# Patient Record
Sex: Male | Born: 1955 | Race: White | Hispanic: No | Marital: Married | Smoking: Never smoker
Health system: Southern US, Community
[De-identification: ages and names within clinical notes are randomized; demographics above are authoritative.]

---

## 1999-03-09 ENCOUNTER — Ambulatory Visit: Admission: RE | Admit: 1999-03-09 | Discharge: 1999-03-09 | Payer: Self-pay | Admitting: Surgery

## 1999-03-09 ENCOUNTER — Encounter: Payer: Self-pay | Admitting: Surgery

## 2002-11-22 ENCOUNTER — Ambulatory Visit (HOSPITAL_COMMUNITY): Admission: RE | Admit: 2002-11-22 | Discharge: 2002-11-22 | Payer: Self-pay | Admitting: Pulmonary Disease

## 2005-05-30 IMAGING — CT CT PELVIS W/O CM
1 series · 15 of 32 positions shown, 19 images · non-contrast
Comparison: none

CLINICAL DATA: Left-sided renal colic and flank pain.  Previous history of renal calculi.
TECHNIQUE: Multidetector helical CT of the abdomen and pelvis was performed following oral contrast administration.  No intravenous contrast was administered due to the patient?s elevated serum creatinine level.  There are no prior studies available for comparison.

[Series 7490: — · axial · 0.78mm/px · z∈[+1370,+1824]mm · 15 of 102 slices shown, 19 images]
[im 7/102  soft-tissue]
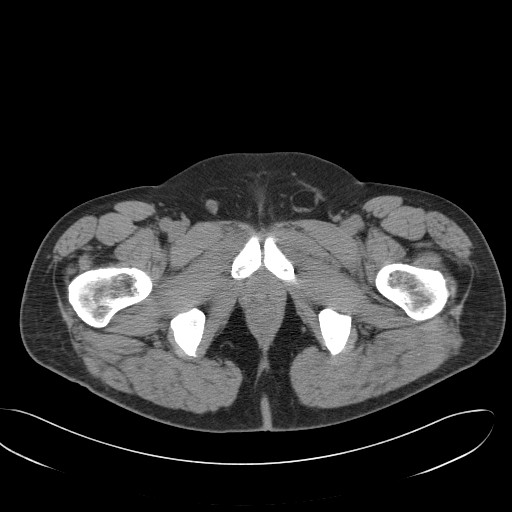
[im 7/102  bone]
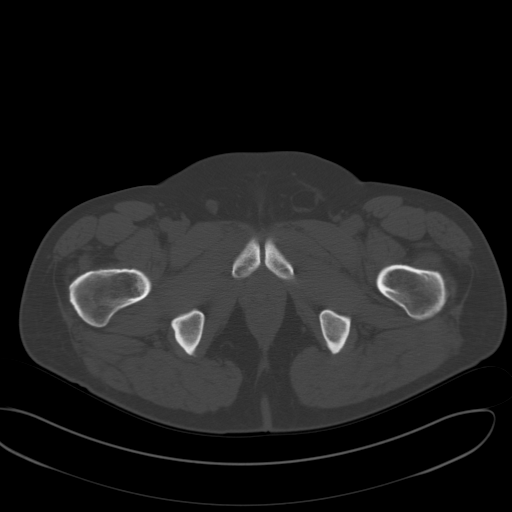
[im 14/102  soft-tissue]
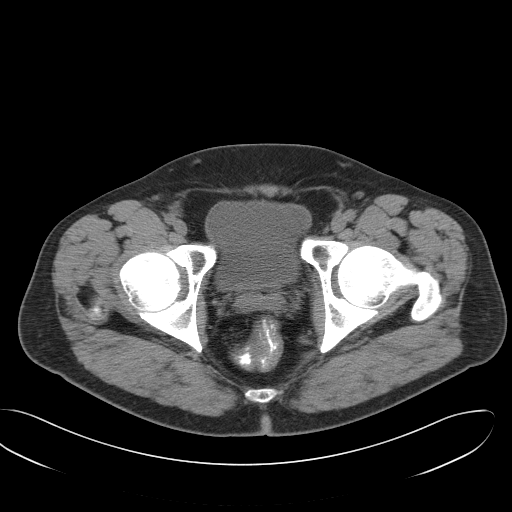
[im 20/102  soft-tissue]
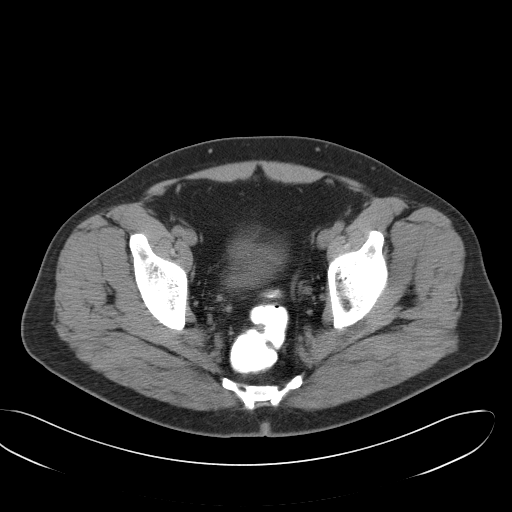
[im 30/102  soft-tissue]
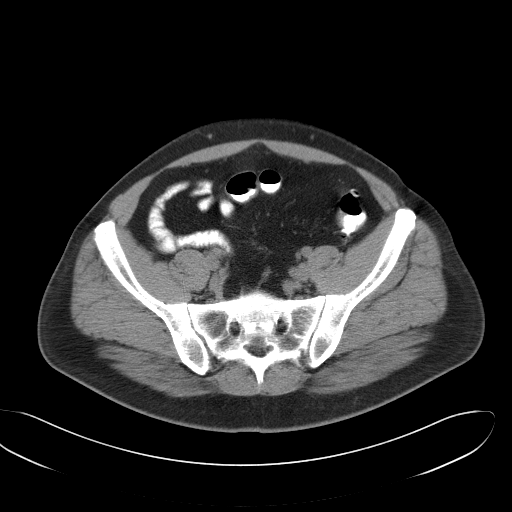
[im 36/102  soft-tissue]
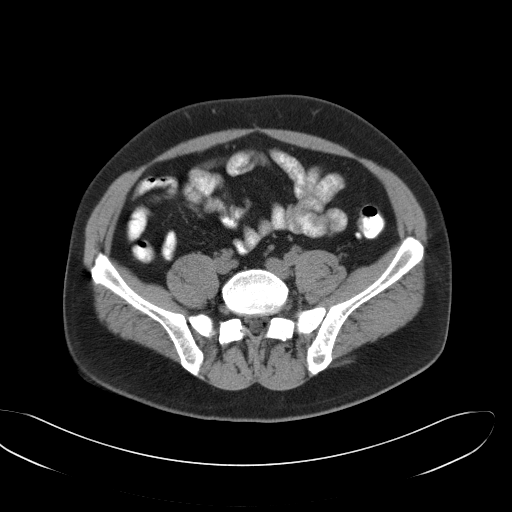
[im 43/102  soft-tissue]
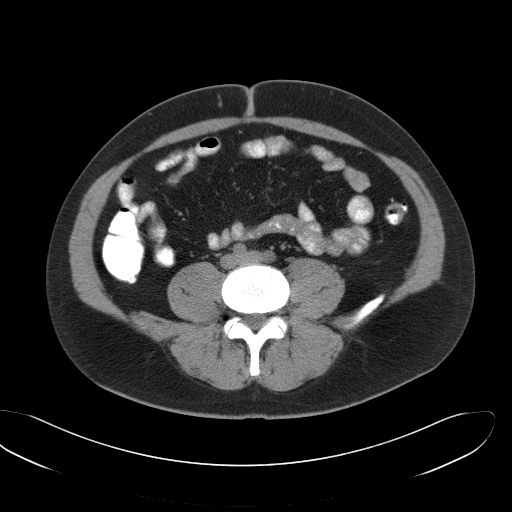
[im 53/102  soft-tissue]
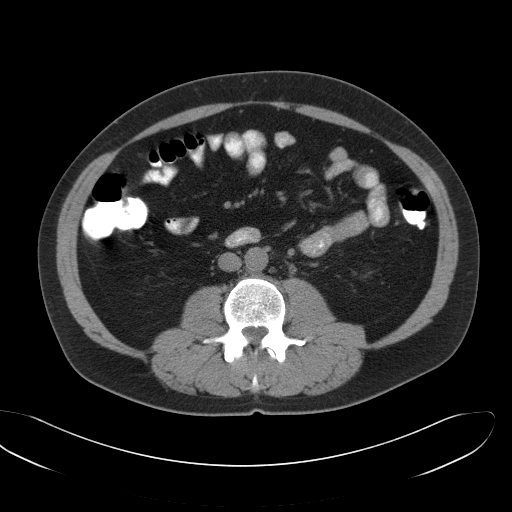
[im 59/102  soft-tissue]
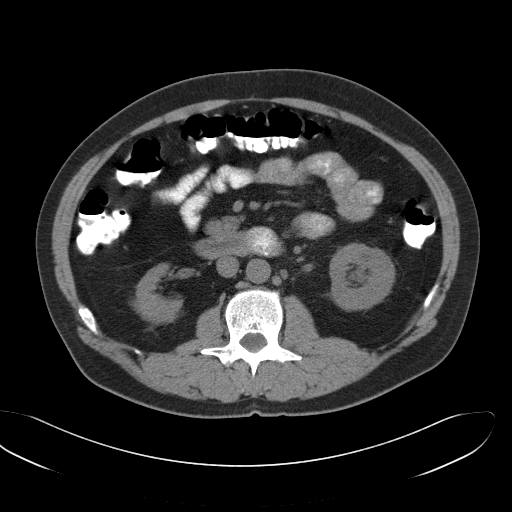
[im 66/102  soft-tissue]
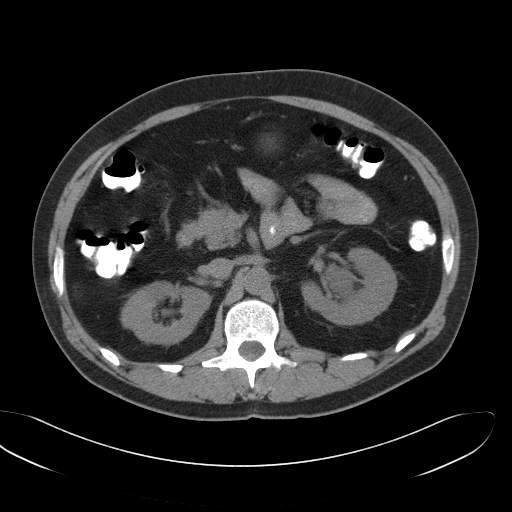
[im 66/102  bone]
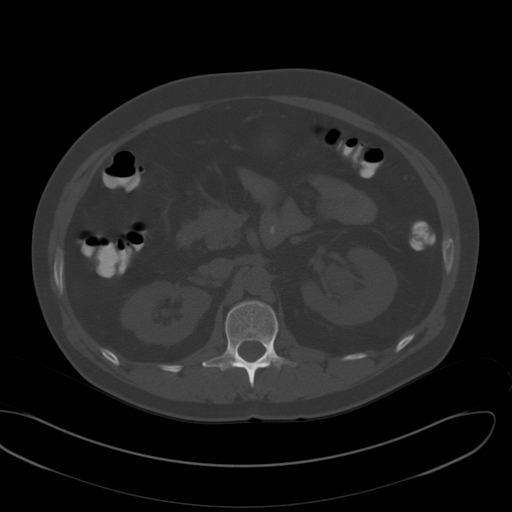
[im 72/102  soft-tissue]
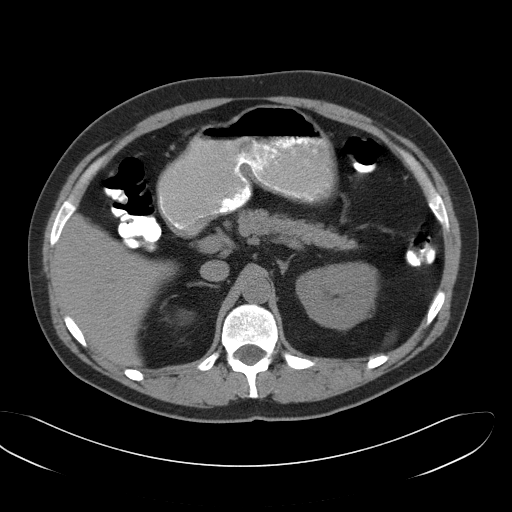
[im 82/102  soft-tissue]
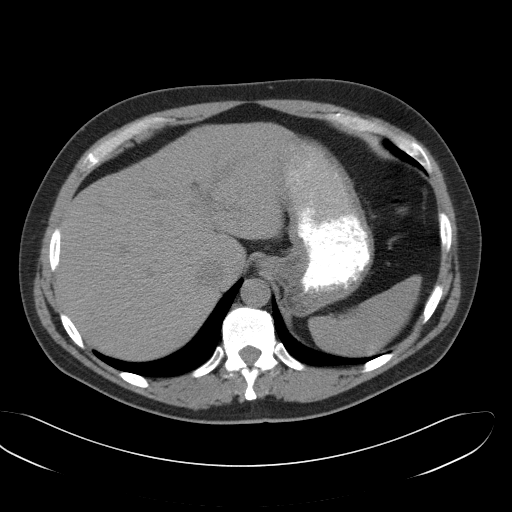
[im 88/102  soft-tissue]
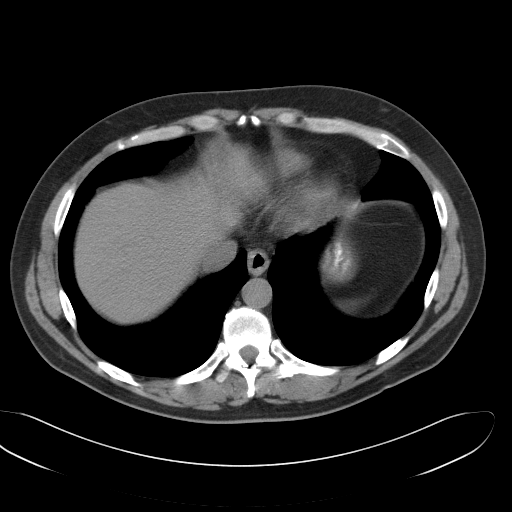
[im 88/102  lung]
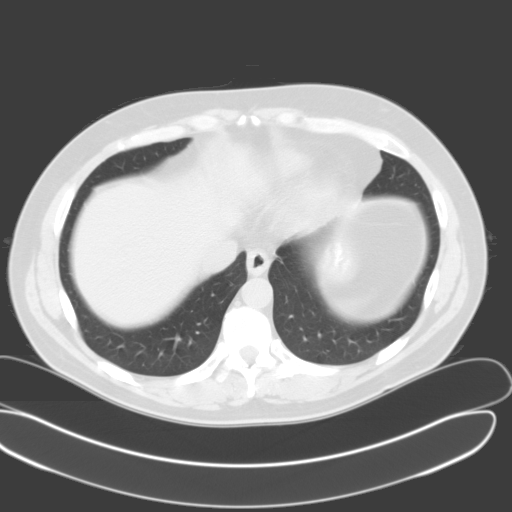
[im 92/102  lung]
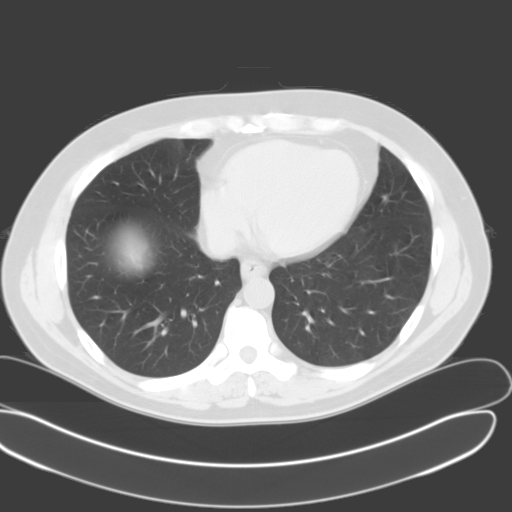
[im 95/102  soft-tissue]
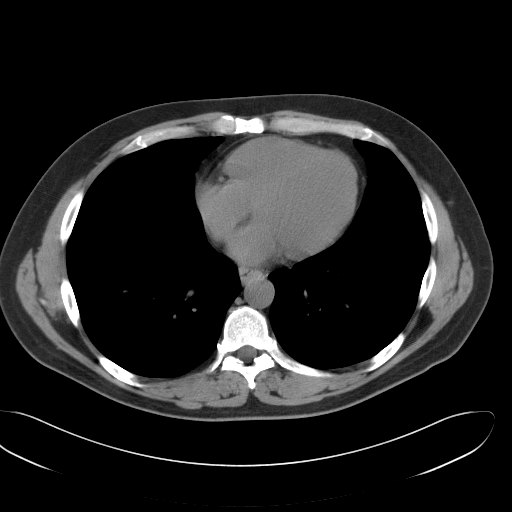
[im 95/102  lung]
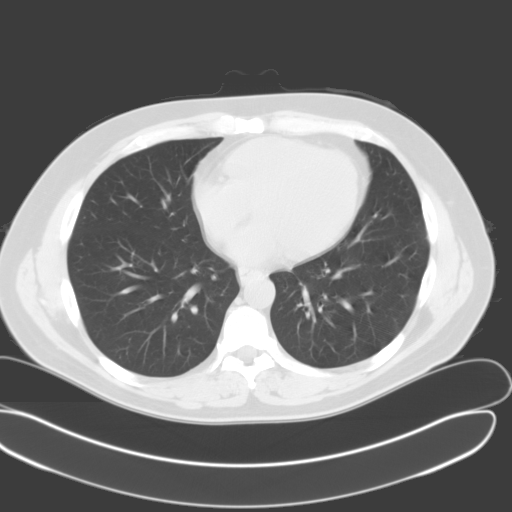
[im 98/102  lung]
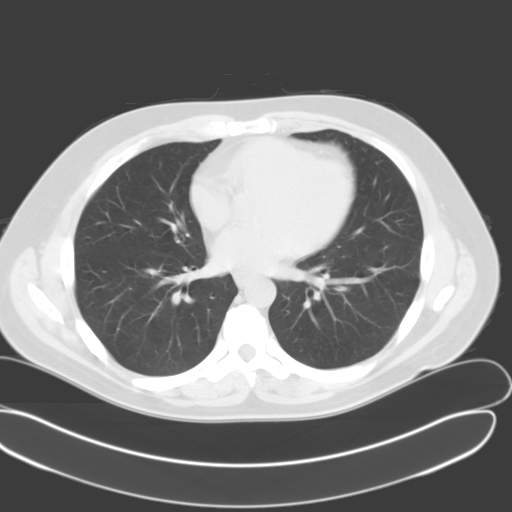

[15 of 32 positions shown; findings below may reference images not displayed]

ABDOMEN CT WITHOUT CONTRAST
 A tiny 1 mm calculus is noted in the lower pole of the left kidney.  A tiny less than 1 cm low attenuation lesion is also seen in the anterior lower pole of the left kidney, likely representing a small cyst.  Mild left pelvocaliectasis is seen as well as ureterectasis.  There is no evidence of right-sided pelvocaliectasis.  No right renal calculi are seen.  

 The other abdominal parenchymal organs are unremarkable in appearance on this noncontrast study.  The gallbladder is unremarkable.  There is no evidence of mass or inflammatory process.  Diverticulosis of the descending colon is noted however, there is no evidence of diverticulitis.

 IMPRESSION
 1.  Mild left-sided pelvocaliectasis and ureterectasis.  See pelvis CT report below.
 2.  Tiny 1 mm calculus in the lower pole of the left kidney and probable tiny left renal cyst.
 3.  Diverticulosis of the descending colon.  No evidence of diverticulitis.

 PELVIS CT WITHOUT CONTRAST
 Left ureterectasis is seen.  There is a calculus in the distal left ureter measuring 6 x 7 mm.  This is approximately 1 cm proximal to the ureterovesical junction.  No calculi are seen within the urinary bladder.  

 Diverticulosis of the sigmoid colon is seen.  However, there is no evidence of diverticulitis.  There is no evidence of other inflammatory processes or abnormal fluid collections in the pelvis.  There is no evidence of pelvic masses or adenopathy.

 IMPRESSION
 1.  6 x 7 mm calculus in the distal left ureter.  This results in mild left hydronephrosis and ureterectasis.
 2.  Sigmoid diverticulosis.  No evidence of diverticulitis.

## 2018-11-19 ENCOUNTER — Other Ambulatory Visit: Payer: Self-pay

## 2018-11-19 DIAGNOSIS — Z20822 Contact with and (suspected) exposure to covid-19: Secondary | ICD-10-CM

## 2018-11-20 LAB — NOVEL CORONAVIRUS, NAA: SARS-CoV-2, NAA: NOT DETECTED

## 2020-04-04 ENCOUNTER — Ambulatory Visit (HOSPITAL_COMMUNITY)
Admission: RE | Admit: 2020-04-04 | Discharge: 2020-04-04 | Disposition: A | Discharge status: Home / Self Care | Payer: Medicare Other | Source: Ambulatory Visit | Attending: Family Medicine | Admitting: Family Medicine

## 2020-04-04 ENCOUNTER — Other Ambulatory Visit: Payer: Self-pay

## 2020-04-04 DIAGNOSIS — Z136 Encounter for screening for cardiovascular disorders: Secondary | ICD-10-CM | POA: Diagnosis not present

## 2020-04-04 DIAGNOSIS — R011 Cardiac murmur, unspecified: Secondary | ICD-10-CM | POA: Insufficient documentation

## 2020-07-03 ENCOUNTER — Ambulatory Visit: Payer: Medicare Other | Admitting: Internal Medicine

## 2020-07-03 ENCOUNTER — Ambulatory Visit (HOSPITAL_COMMUNITY)
Admission: RE | Admit: 2020-07-03 | Discharge: 2020-07-03 | Disposition: A | Discharge status: Home / Self Care | Payer: Medicare Other | Source: Ambulatory Visit | Attending: Internal Medicine | Admitting: Internal Medicine

## 2020-07-03 ENCOUNTER — Other Ambulatory Visit: Payer: Self-pay

## 2020-07-03 ENCOUNTER — Encounter: Payer: Self-pay | Admitting: Internal Medicine

## 2020-07-03 VITALS — BP 148/60 | HR 79 | Ht 67.0 in | Wt 173.4 lb

## 2020-07-03 DIAGNOSIS — R011 Cardiac murmur, unspecified: Secondary | ICD-10-CM | POA: Diagnosis not present

## 2020-07-03 DIAGNOSIS — R0989 Other specified symptoms and signs involving the circulatory and respiratory systems: Secondary | ICD-10-CM | POA: Diagnosis not present

## 2020-07-03 LAB — ECHOCARDIOGRAM COMPLETE
AR max vel: 1.37 cm2
AV Area VTI: 1.37 cm2
AV Area mean vel: 1.39 cm2
AV Mean grad: 7 mmHg
AV Peak grad: 14.4 mmHg
Ao pk vel: 1.9 m/s
Area-P 1/2: 3.3 cm2
Height: 67 in
S' Lateral: 2.7 cm
Weight: 2774.4 oz

## 2020-07-03 NOTE — Progress Notes (Signed)
*  PRELIMINARY RESULTS* Echocardiogram 2D Echocardiogram has been performed.  Stacey Drain 07/03/2020, 2:40 PM

## 2020-07-03 NOTE — Patient Instructions (Signed)
Medication Instructions:  Your physician recommends that you continue on your current medications as directed. Please refer to the Current Medication list given to you today.  *If you need a refill on your cardiac medications before your next appointment, please call your pharmacy*   Lab Work: NONE   If you have labs (blood work) drawn today and your tests are completely normal, you will receive your results only by: MyChart Message (if you have MyChart) OR A paper copy in the mail If you have any lab test that is abnormal or we need to change your treatment, we will call you to review the results.   Testing/Procedures: Your physician has requested that you have an echocardiogram. Echocardiography is a painless test that uses sound waves to create images of your heart. It provides your doctor with information about the size and shape of your heart and how well your heart's chambers and valves are working. This procedure takes approximately one hour. There are no restrictions for this procedure.  Your physician has requested that you have a carotid duplex. This test is an ultrasound of the carotid arteries in your neck. It looks at blood flow through these arteries that supply the brain with blood. Allow one hour for this exam. There are no restrictions or special instructions.    Follow-Up: At [facility], you and your health needs are our priority.  As part of our continuing mission to provide you with exceptional heart care, we have created designated Provider Care Teams.  These Care Teams include your primary Cardiologist (physician) and Advanced Practice Providers (APPs -  Physician Assistants and Nurse Practitioners) who all work together to provide you with the care you need, when you need it.  We recommend signing up for the patient portal called "MyChart".  Sign up information is provided on this After Visit Summary.  MyChart is used to connect with patients for Virtual Visits  (Telemedicine).  Patients are able to view lab/test results, encounter notes, upcoming appointments, etc.  Non-urgent messages can be sent to your provider as well.   To learn more about what you can do with MyChart, go to [email or web address].    Your next appointment:    Pending test results   The format for your next appointment:   In Person  Provider:   Dietrich Pates, MD   Other Instructions Thank you for choosing [facility]!

## 2020-07-03 NOTE — Progress Notes (Signed)
   Cardiology Office Note   Date:  07/03/2020   ID:  Terry Wagner, DOB 12/17/1955, MRN [id number]  PCP:  Patient, No Pcp Per (Inactive)  Cardiologist:   Dietrich Pates, MD  Pt referred for evaluation of murmur by IM      History of Present Illness: Terry Wagner is a 65 y.o. male with a history of HTN and HL  Also Hx murmur    Seen in IM clinic recently   Told had a murmur   Setnt to cardiology  The pt denies CP  Breathing is OK  No dizziness  No palpitatoins         Current Meds  Medication Sig   Ascorbic Acid (VITAMIN C) 1000 MG tablet Take 1,000 mg by mouth daily.   Cholecalciferol (VITAMIN D3) 25 MCG (1000 UT) CAPS Take by mouth.   Multiple Vitamin (MULTIVITAMIN ADULT PO) Take by mouth.     Allergies:   Patient has no allergy information on record.   History reviewed. No pertinent past medical history.  History reviewed. No pertinent surgical history.   Social History:  The patient  reports that he has never smoked. He has never been exposed to tobacco smoke. He has never used smokeless tobacco. He reports that he does not drink alcohol and does not use drugs.   Family History:  The patient's family history includes Cancer in his daughter and sister.    ROS:  Please see the history of present illness. All other systems are reviewed and  Negative to the above problem except as noted.    PHYSICAL EXAM: VS:  BP (!) 148/60   Pulse 79   Ht 5\' 7"  (1.702 m)   Wt 173 lb 6.4 oz (78.7 kg)   SpO2 98%   BMI 27.16 kg/m   GEN: Well nourished, well developed, in no acute distress  HEENT: normal  Neck: no JVD  Bilateral bruits Cardiac: RRR; Gr II/VI systolic murmur base   No LE 2+ pulses PT  Respiratory:  clear to auscultation bilaterally, normal work of breathing GI: soft, nontender, nondistended, + BS  No hepatomegaly  MS: no deformity Moving all extremities   Skin: warm and dry, no rash Neuro:  Strength and sensation are intact Psych: euthymic mood, full  affect   EKG:  EKG is not  ordered today.  On 04/04/20 NSR   LVH with repol abnormaltity    Lipid Panel No results found for: CHOL, TRIG, HDL, CHOLHDL, VLDL, LDLCALC, LDLDIRECT    Wt Readings from Last 3 Encounters:  07/03/20 173 lb 6.4 oz (78.7 kg)      ASSESSMENT AND PLAN:  1  Murmur  Murmur sounds like aortic sclerosis   Will set up for an echo to evaluate  2  Carotid bruits   St up for USN of neck  3  HTN  BP is a little elevated today   Follow for now  Not on any agnets   F/U based on results     Current medicines are reviewed at length with the patient today.  The patient does not have concerns regarding medicines.  Signed, 07/05/20, MD  07/03/2020 9:35 AM    [facility] [address] [place], [place], [place]  [place] Phone: [phone]; Fax: [phone]

## 2020-07-11 ENCOUNTER — Ambulatory Visit (HOSPITAL_COMMUNITY)
Admission: RE | Admit: 2020-07-11 | Discharge: 2020-07-11 | Disposition: A | Discharge status: Home / Self Care | Payer: Medicare Other | Source: Ambulatory Visit | Attending: Internal Medicine | Admitting: Internal Medicine

## 2020-07-11 ENCOUNTER — Other Ambulatory Visit: Payer: Self-pay

## 2020-07-11 DIAGNOSIS — R0989 Other specified symptoms and signs involving the circulatory and respiratory systems: Secondary | ICD-10-CM | POA: Diagnosis present

## 2020-07-13 ENCOUNTER — Telehealth: Payer: Self-pay | Admitting: Nurse Practitioner

## 2020-07-13 ENCOUNTER — Other Ambulatory Visit: Payer: Self-pay | Admitting: Nurse Practitioner

## 2020-07-13 DIAGNOSIS — I6523 Occlusion and stenosis of bilateral carotid arteries: Secondary | ICD-10-CM

## 2020-07-13 DIAGNOSIS — E785 Hyperlipidemia, unspecified: Secondary | ICD-10-CM

## 2020-07-13 MED ORDER — ASPIRIN 81 MG PO TBEC: 81.0000 mg | DELAYED_RELEASE_TABLET | Freq: Every day | ORAL | Status: AC

## 2020-07-13 NOTE — Telephone Encounter (Signed)
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

## 2020-07-13 NOTE — Progress Notes (Signed)
Aspirin 81 mg daily added to patient's medication list

## 2020-07-13 NOTE — Telephone Encounter (Signed)
Left detailed voicemail regarding lab appointment scheduled for 7/6 between the hours of 7:30 am and 4:45 pm at our office. Advised him to call back to reschedule if needed. Orders for lipid studies and referral to [facility] placed.

## 2020-07-19 ENCOUNTER — Other Ambulatory Visit: Payer: Medicare Other

## 2020-07-19 ENCOUNTER — Other Ambulatory Visit: Payer: Self-pay

## 2020-07-19 DIAGNOSIS — E785 Hyperlipidemia, unspecified: Secondary | ICD-10-CM

## 2020-07-19 DIAGNOSIS — I6523 Occlusion and stenosis of bilateral carotid arteries: Secondary | ICD-10-CM

## 2020-07-20 LAB — NMR LIPOPROF + GRAPH
Cholesterol, Total: 235 mg/dL — ABNORMAL HIGH (ref 100–199)
HDL Particle Number: 32.7 umol/L (ref >=30.5)
HDL-C: 40 mg/dL (ref >39)
LDL Particle Number: 1549 nmol/L — ABNORMAL HIGH (ref <1000)
LDL Size: 20.1 nm — ABNORMAL LOW (ref >20.5)
LDL-C (NIH Calc): 155 mg/dL — ABNORMAL HIGH (ref 0–99)
LP-IR Score: 65 — ABNORMAL HIGH (ref <=45)
Small LDL Particle Number: 799 nmol/L — ABNORMAL HIGH (ref <=527)
Triglycerides: 218 mg/dL — ABNORMAL HIGH (ref 0–149)

## 2020-07-20 LAB — APOLIPOPROTEIN B: Apolipoprotein B: 114 mg/dL — ABNORMAL HIGH (ref <90)

## 2020-07-20 LAB — LIPOPROTEIN A (LPA): Lipoprotein (a): 113.9 nmol/L — ABNORMAL HIGH (ref <75.0)

## 2020-07-24 ENCOUNTER — Telehealth: Payer: Self-pay | Admitting: *Deleted

## 2020-07-24 DIAGNOSIS — E785 Hyperlipidemia, unspecified: Secondary | ICD-10-CM

## 2020-07-24 MED ORDER — ROSUVASTATIN CALCIUM 40 MG PO TABS: 40.0000 mg | ORAL_TABLET | Freq: Every day | ORAL | 11 refills | Status: AC

## 2020-07-24 NOTE — Telephone Encounter (Signed)
-----   Message from Dietrich Pates V, MD sent at 07/23/2020  9:40 PM EDT ----- LDL is 155  Goal less than 70    Particle number high   ApoB and Lp(a) high   Triglcyerides elevated at 218   Watch seets. Should be on statin to lower   Start Crestor 40 mg    F/U lipids in 8 wks with AST Watch sweets

## 2020-07-24 NOTE — Telephone Encounter (Signed)
Pt notified of plan of care and verbalized understanding

## 2023-01-17 IMAGING — US US CAROTID DUPLEX BILAT
1 series · 13 of 24 positions shown · non-contrast
Comparison: None.

CLINICAL DATA: Hypertension, asymptomatic carotid bruit

EXAM:
BILATERAL CAROTID DUPLEX ULTRASOUND
TECHNIQUE: Gray scale imaging, color Doppler and duplex ultrasound were
performed of bilateral carotid and vertebral arteries in the neck.

[Series 1: us carotid bilateral · 13 of 70 slices shown]
[im 1/70]
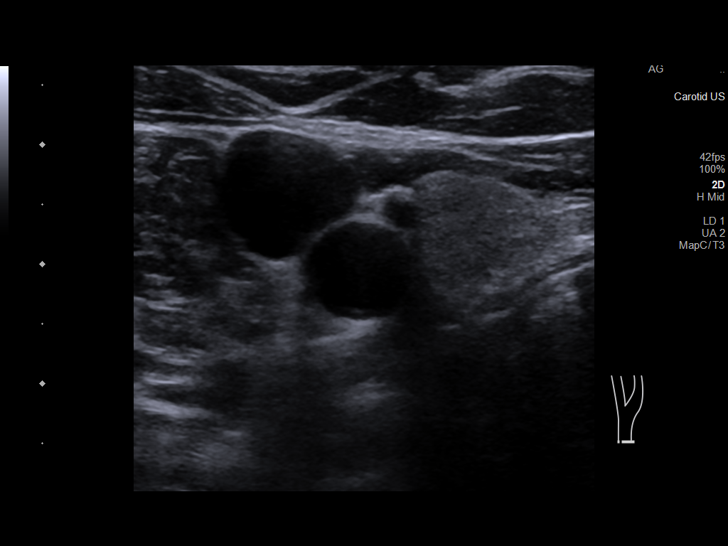
[im 7/70]
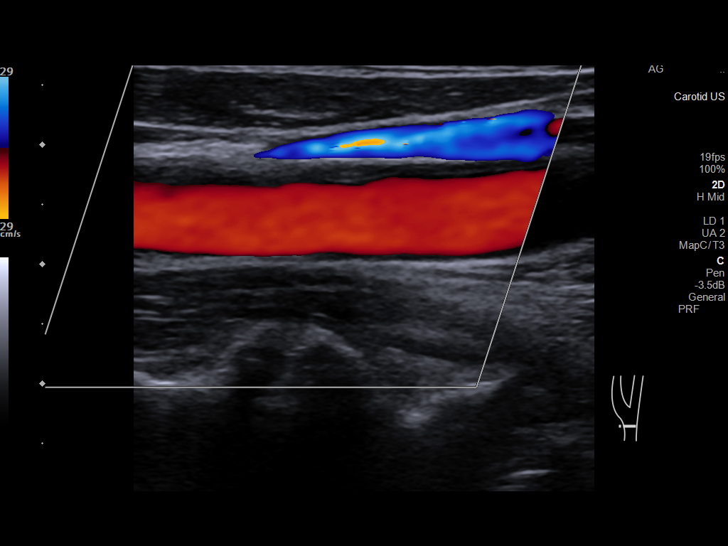
[im 13/70]
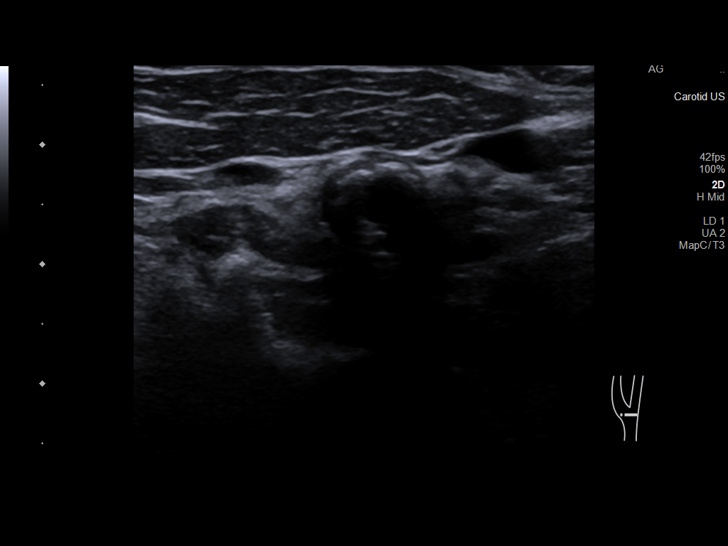
[im 19/70]
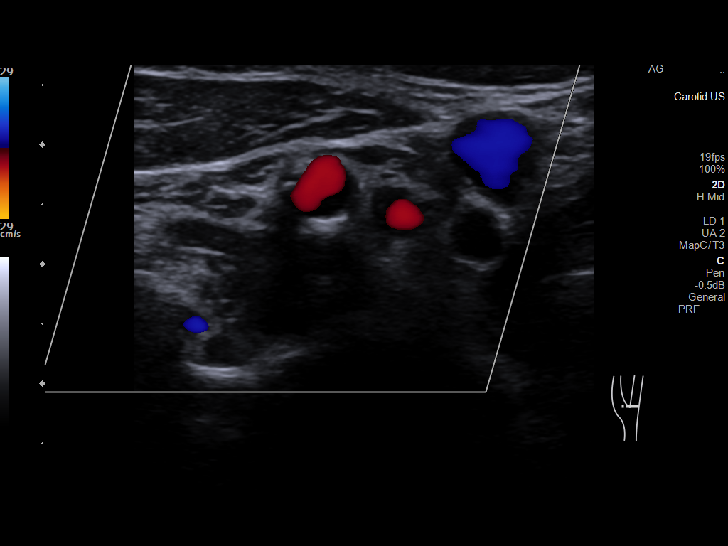
[im 25/70]
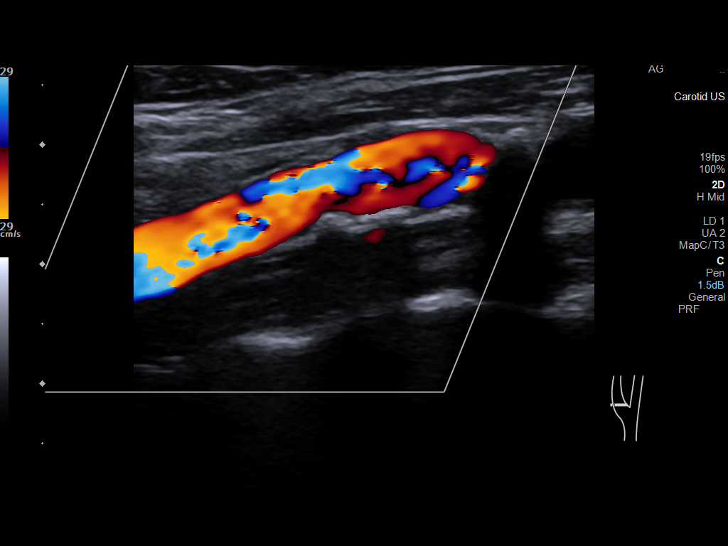
[im 31/70]
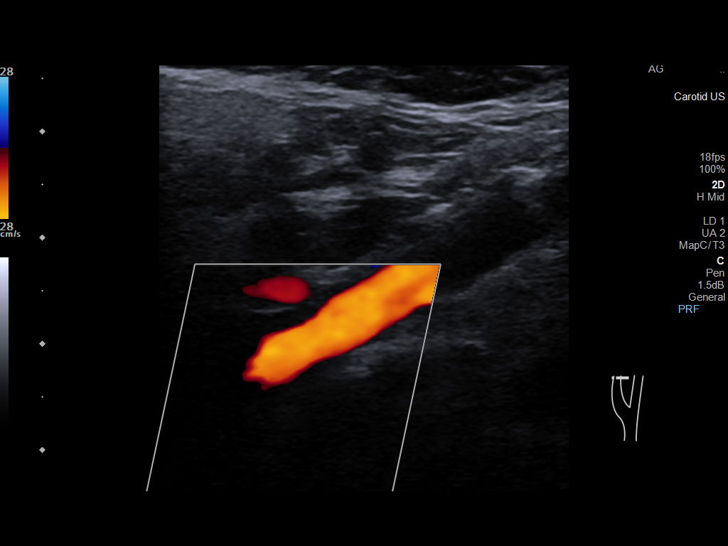
[im 37/70]
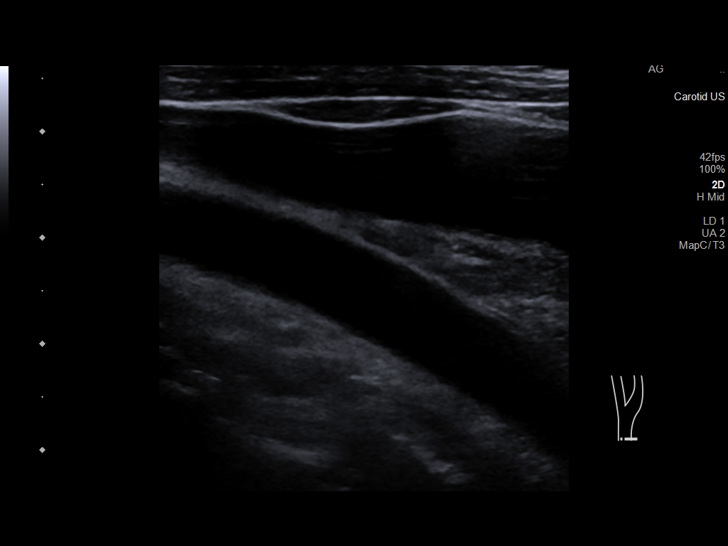
[im 40/70]
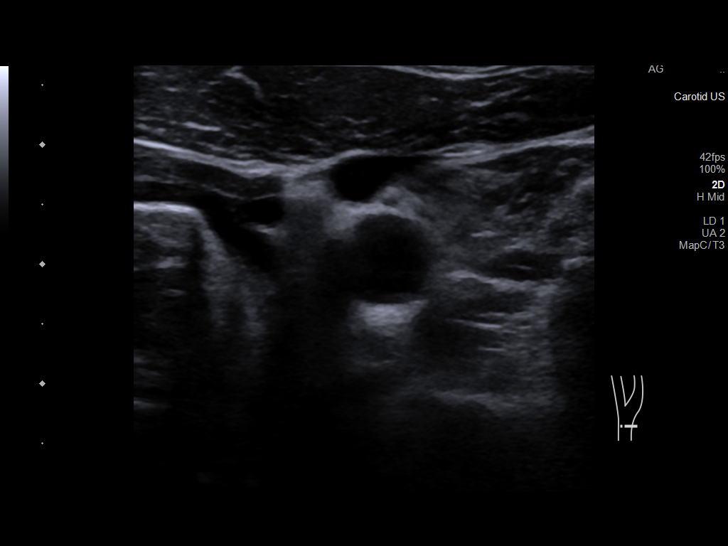
[im 46/70]
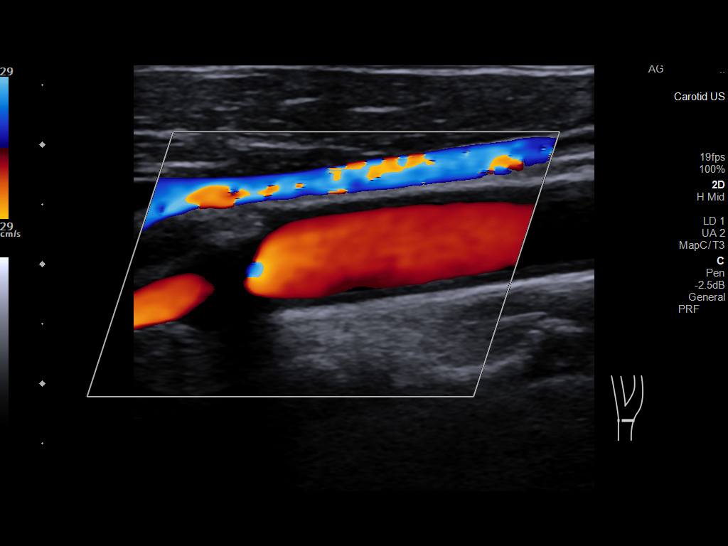
[im 52/70]
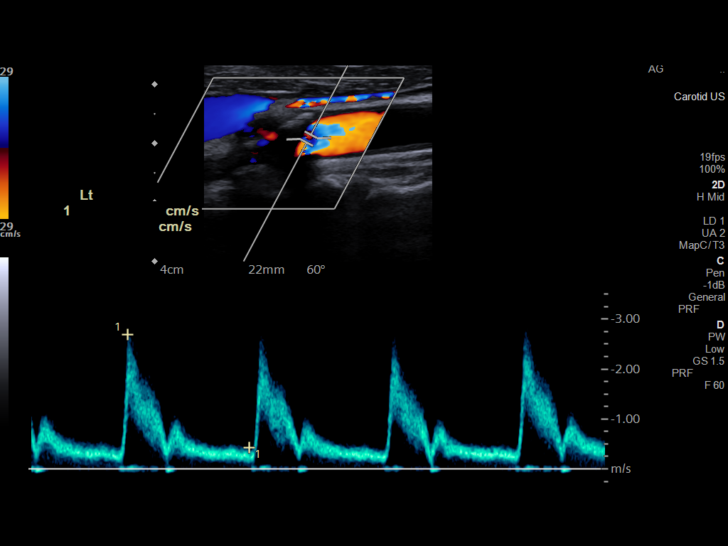
[im 58/70]
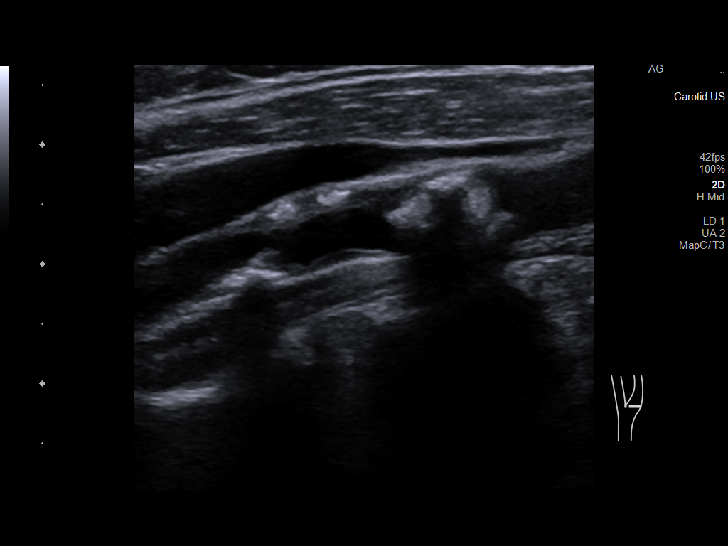
[im 64/70]
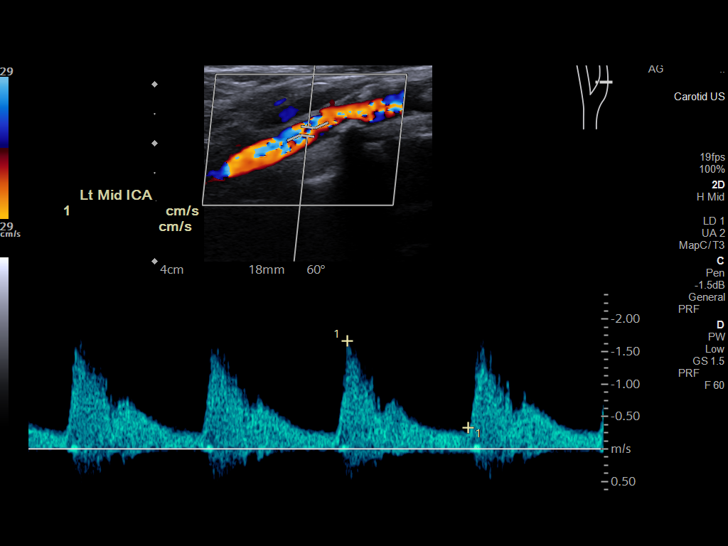
[im 70/70]
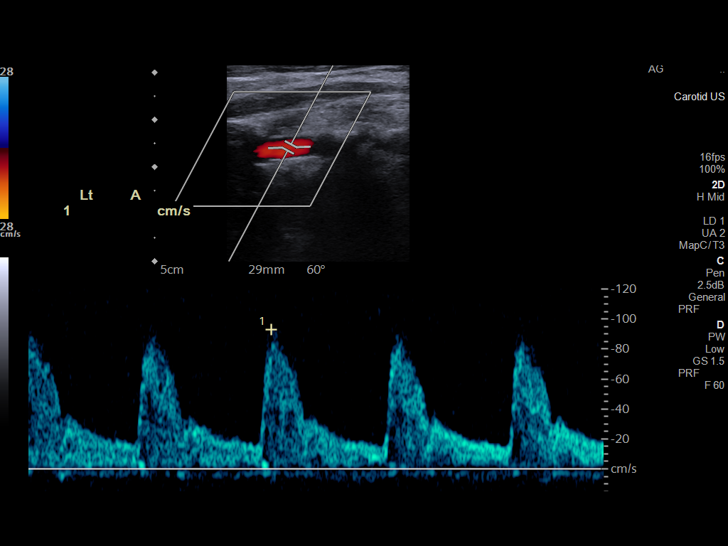

[13 of 24 positions shown; findings below may reference images not displayed]

FINDINGS: Criteria: Quantification of carotid stenosis is based on velocity
parameters that correlate the residual internal carotid diameter
with NASCET-based stenosis levels, using the diameter of the distal
internal carotid lumen as the denominator for stenosis measurement.

The following velocity measurements were obtained:

RIGHT

ICA: 253/42 cm/sec

CCA: 105/18 cm/sec

SYSTOLIC ICA/CCA RATIO:

ECA: 400 cm/sec

LEFT

ICA: 350/48 cm/sec

CCA: 121/21 cm/sec

SYSTOLIC ICA/CCA RATIO:

ECA: 335 cm/sec

RIGHT CAROTID ARTERY: Intimal thickening and heterogeneous calcified
proximal ICA atherosclerosis. Right ICA velocity elevation measures
up to 253/42 centimeters/second. Only mild turbulent flow. Right ICA
stenosis estimated at 50-69% by ultrasound criteria.

RIGHT VERTEBRAL ARTERY:  Normal antegrade flow

LEFT CAROTID ARTERY: Similar intimal thickening and calcified
proximal ICA atherosclerosis. Proximal ICA velocity elevation
measures up to 350/48 centimeters/second with spectral
broadening/turbulent flow. Left ICA stenosis estimated at greater
than 70%.

LEFT VERTEBRAL ARTERY:  Normal antegrade flow
IMPRESSION: Bilateral carotid atherosclerosis.

Right ICA stenosis estimated at 50-69%

Left ICA stenosis estimated at greater than 70%.

Patent antegrade vertebral flow bilaterally
